# Patient Record
Sex: Male | Born: 1985 | Race: White | Hispanic: No | Marital: Married | Smoking: Never smoker
Health system: Southern US, Community
[De-identification: ages and names within clinical notes are randomized; demographics above are authoritative.]

## PROBLEM LIST (undated history)

## (undated) DIAGNOSIS — J45909 Unspecified asthma, uncomplicated: Secondary | ICD-10-CM

## (undated) DIAGNOSIS — I10 Essential (primary) hypertension: Secondary | ICD-10-CM

## (undated) HISTORY — PX: OTHER SURGICAL HISTORY: SHX169

---

## 2013-03-19 ENCOUNTER — Emergency Department (HOSPITAL_COMMUNITY)
Admission: EM | Admit: 2013-03-19 | Discharge: 2013-03-19 | Disposition: A | Discharge status: Home / Self Care | Payer: Self-pay | Attending: Emergency Medicine | Admitting: Emergency Medicine

## 2013-03-19 ENCOUNTER — Encounter (HOSPITAL_COMMUNITY): Payer: Self-pay | Admitting: *Deleted

## 2013-03-19 ENCOUNTER — Emergency Department (HOSPITAL_COMMUNITY): Payer: Self-pay

## 2013-03-19 DIAGNOSIS — J45901 Unspecified asthma with (acute) exacerbation: Secondary | ICD-10-CM | POA: Insufficient documentation

## 2013-03-19 HISTORY — DX: Unspecified asthma, uncomplicated: J45.909

## 2013-03-19 MED ORDER — PREDNISONE 20 MG PO TABS
60.0000 mg | ORAL_TABLET | Freq: Once | ORAL | Status: AC
  Administered 2013-03-19: 60 mg via ORAL
  Filled 2013-03-19: qty 3

## 2013-03-19 MED ORDER — ALBUTEROL SULFATE (2.5 MG/3ML) 0.083% IN NEBU: 2.5000 mg | INHALATION_SOLUTION | Freq: Four times a day (QID) | RESPIRATORY_TRACT | Status: DC | PRN

## 2013-03-19 MED ORDER — ALBUTEROL SULFATE (5 MG/ML) 0.5% IN NEBU
5.0000 mg | INHALATION_SOLUTION | Freq: Once | RESPIRATORY_TRACT | Status: AC
  Administered 2013-03-19: 5 mg via RESPIRATORY_TRACT
  Filled 2013-03-19: qty 1

## 2013-03-19 MED ORDER — IPRATROPIUM BROMIDE 0.02 % IN SOLN
0.5000 mg | Freq: Once | RESPIRATORY_TRACT | Status: AC
  Administered 2013-03-19: 0.5 mg via RESPIRATORY_TRACT
  Filled 2013-03-19: qty 2.5

## 2013-03-19 MED ORDER — PREDNISONE 20 MG PO TABS: 40.0000 mg | ORAL_TABLET | Freq: Every day | ORAL | Status: DC

## 2013-03-19 NOTE — ED Notes (Signed)
RT called for neb treatment

## 2013-03-19 NOTE — ED Notes (Signed)
Pt reports SOB which started today.  Pt has hx of asthma.  Denies any cp at this time.  Pt in NAD.

## 2013-03-19 NOTE — ED Provider Notes (Signed)
History  This chart was scribed for non-physician practitioner Sharilyn Sites, PA-C working with Gavin Pound. Oletta Lamas, MD, by Candelaria Stagers, ED Scribe. This patient was seen in room WTR9/WTR9 and the patient's care was started at 3:05 PM   CSN: [id number]  Arrival date & time 03/19/13  1445   None     Chief Complaint  Patient presents with  . Shortness of Breath     The history is provided by the patient. No language interpreter was used.   Juan Jenkins is a 27 y.o. male who presents to the Emergency Department complaining of SOB that became worse several days ago after he ran out of his albuterol nebulizer medication.  Pt has h/o asthma.  Pt reports he typical takes nebulizer and prednisone when these sx occur.  Nothing seems to make the sx better or worse.  No specific trigger to the attacks.  Mild, non-productive cough for the past few days since running out of albuterol.  Denies recent illness, sick contacts, fever, sweats, or chills.       Past Medical History  Diagnosis Date  . Asthma     History reviewed. No pertinent past surgical history.  No family history on file.  History  Substance Use Topics  . Smoking status: Never Smoker   . Smokeless tobacco: Not on file  . Alcohol Use: Yes     Comment: occa      Review of Systems  Respiratory: Positive for shortness of breath and wheezing.   All other systems reviewed and are negative.    Allergies  Review of patient's allergies indicates no known allergies.  Home Medications  No current outpatient prescriptions on file.  BP 148/73  Pulse 76  Temp(Src) 97.8 F (36.6 C) (Oral)  Resp 18  SpO2 96%  Physical Exam  Nursing note and vitals reviewed. Constitutional: He is oriented to person, place, and time. He appears well-developed and well-nourished.  HENT:  Head: Normocephalic and atraumatic.  Mouth/Throat: Oropharynx is clear and moist.  Eyes: Conjunctivae and EOM are normal.  Neck: Normal range of motion.  Neck supple.  Cardiovascular: Normal rate, regular rhythm and normal heart sounds.   Pulmonary/Chest: Effort normal. No respiratory distress. He has wheezes (diffuse bilateral wheezes).  Musculoskeletal: Normal range of motion.  Neurological: He is alert and oriented to person, place, and time.  Skin: Skin is warm and dry.  Psychiatric: He has a normal mood and affect.    ED Course  Procedures  DIAGNOSTIC STUDIES: Oxygen Saturation is 96% on room air, normal by my interpretation.    COORDINATION OF CARE:  3:08 PM Discussed course of care with pt which includes chest xray and breathing treatment.  Pt understands and agrees.   3:15 PM Ordered: Proventil 0.5% nebulizer solution 5 mg, Atrovent nebulizer solution 0.5 mg, and Deltasone tablet 60 mg.   4:03 PM Recheck: Pt reports his symptoms have improved.   Labs Reviewed - No data to display Dg Chest 2 View  03/19/2013  *RADIOLOGY REPORT*  Clinical Data: Shortness of breath  CHEST - 2 VIEW  Comparison: None.  Findings: Cardiomediastinal silhouette is unremarkable.  No acute infiltrate or pleural effusion.  No pulmonary edema.  Bony thorax is unremarkable.  IMPRESSION: No active disease.   Original Report Authenticated By: Natasha Mead, M.D.      1. Asthma exacerbation, mild       MDM   Patient with mild asthma exacerbation after running out of his nebulizer medication a few days  ago. Albuterol and Atrovent nebulizer treatment along with prednisone 60 mg were given in the ED with good resolution of wheezing. Patient states he feels much better. Rx albuterol as her solution and five-day course of prednisone 40 mg. Discussed plan with patient-he agreed. Return precautions advised.  I personally performed the services described in this documentation, which was scribed in my presence. The recorded information has been reviewed and is accurate.        Garlon Hatchet, PA-C 03/19/13 [phone]

## 2013-03-19 NOTE — ED Provider Notes (Signed)
Medical screening examination/treatment/procedure(s) were performed by non-physician practitioner and as supervising physician I was immediately available for consultation/collaboration.   Gavin Pound. Seri Kimmer, MD 03/19/13 1704

## 2013-05-30 ENCOUNTER — Encounter (HOSPITAL_COMMUNITY): Payer: Self-pay | Admitting: *Deleted

## 2013-05-30 ENCOUNTER — Emergency Department (HOSPITAL_COMMUNITY)
Admission: EM | Admit: 2013-05-30 | Discharge: 2013-05-30 | Disposition: A | Discharge status: Home / Self Care | Payer: Self-pay | Attending: Emergency Medicine | Admitting: Emergency Medicine

## 2013-05-30 DIAGNOSIS — R059 Cough, unspecified: Secondary | ICD-10-CM | POA: Insufficient documentation

## 2013-05-30 DIAGNOSIS — R05 Cough: Secondary | ICD-10-CM | POA: Insufficient documentation

## 2013-05-30 DIAGNOSIS — J45901 Unspecified asthma with (acute) exacerbation: Secondary | ICD-10-CM | POA: Insufficient documentation

## 2013-05-30 DIAGNOSIS — IMO0002 Reserved for concepts with insufficient information to code with codable children: Secondary | ICD-10-CM | POA: Insufficient documentation

## 2013-05-30 DIAGNOSIS — R0682 Tachypnea, not elsewhere classified: Secondary | ICD-10-CM | POA: Insufficient documentation

## 2013-05-30 DIAGNOSIS — Z79899 Other long term (current) drug therapy: Secondary | ICD-10-CM | POA: Insufficient documentation

## 2013-05-30 MED ORDER — PREDNISONE 20 MG PO TABS
60.0000 mg | ORAL_TABLET | Freq: Once | ORAL | Status: AC
  Administered 2013-05-30: 60 mg via ORAL
  Filled 2013-05-30: qty 3

## 2013-05-30 MED ORDER — IPRATROPIUM BROMIDE 0.02 % IN SOLN
0.5000 mg | Freq: Once | RESPIRATORY_TRACT | Status: AC
  Administered 2013-05-30: 0.5 mg via RESPIRATORY_TRACT
  Filled 2013-05-30: qty 2.5

## 2013-05-30 MED ORDER — ALBUTEROL SULFATE (5 MG/ML) 0.5% IN NEBU
5.0000 mg | INHALATION_SOLUTION | Freq: Once | RESPIRATORY_TRACT | Status: AC
  Administered 2013-05-30: 5 mg via RESPIRATORY_TRACT
  Filled 2013-05-30: qty 1

## 2013-05-30 MED ORDER — ALBUTEROL SULFATE HFA 108 (90 BASE) MCG/ACT IN AERS
1.0000 | INHALATION_SPRAY | RESPIRATORY_TRACT | Status: DC | PRN
  Administered 2013-05-30: 2 via RESPIRATORY_TRACT
  Filled 2013-05-30: qty 6.7

## 2013-05-30 MED ORDER — PREDNISONE 20 MG PO TABS: 40.0000 mg | ORAL_TABLET | Freq: Every day | ORAL | Status: DC

## 2013-05-30 NOTE — ED Provider Notes (Signed)
History    CSN: [id number] Arrival date & time 05/30/13  2005  First MD Initiated Contact with Patient 05/30/13 2018     Chief Complaint  Patient presents with  . Shortness of Breath   (Consider location/radiation/quality/duration/timing/severity/associated sxs/prior Treatment) Patient is a 27 y.o. male presenting with shortness of breath. The history is provided by the patient.  Shortness of Breath Severity:  Moderate Onset quality:  Gradual Duration:  2 days Timing:  Constant Progression:  Worsening Chronicity:  Recurrent Context: URI and weather changes   Relieved by:  Nothing Worsened by:  Exertion and activity Ineffective treatments:  Inhaler Associated symptoms: cough and wheezing   Associated symptoms: no abdominal pain, no fever and no sputum production   Risk factors comment:  Hx of asthma but advair inhaler not helping  Past Medical History  Diagnosis Date  . Asthma    History reviewed. No pertinent past surgical history. History reviewed. No pertinent family history. History  Substance Use Topics  . Smoking status: Never Smoker   . Smokeless tobacco: Not on file  . Alcohol Use: Yes     Comment: occa    Review of Systems  Constitutional: Negative for fever.  Respiratory: Positive for cough, shortness of breath and wheezing. Negative for sputum production.   Gastrointestinal: Negative for abdominal pain.  All other systems reviewed and are negative.    Allergies  Review of patient's allergies indicates no known allergies.  Home Medications   Current Outpatient Rx  Name  Route  Sig  Dispense  Refill  . albuterol (PROVENTIL) (2.5 MG/3ML) 0.083% nebulizer solution   Nebulization   Take 3 mLs (2.5 mg total) by nebulization every 6 (six) hours as needed for wheezing.   75 mL   1   . Fluticasone-Salmeterol (ADVAIR DISKUS) 500-50 MCG/DOSE AEPB   Inhalation   Inhale 1 puff into the lungs every 12 (twelve) hours.         . predniSONE (DELTASONE)  20 MG tablet   Oral   Take 2 tablets (40 mg total) by mouth daily.   10 tablet   0    BP 136/86  Pulse 82  Temp(Src) 98.4 F (36.9 C) (Oral)  Resp 18  SpO2 98% Physical Exam  Constitutional: He is oriented to person, place, and time. He appears well-developed and well-nourished. No distress.  HENT:  Head: Normocephalic and atraumatic.  Right Ear: External ear normal.  Left Ear: External ear normal.  Mouth/Throat: Oropharynx is clear and moist.  Eyes: Conjunctivae and EOM are normal. Pupils are equal, round, and reactive to light. Right eye exhibits no discharge.  Neck: Normal range of motion. Neck supple.  Cardiovascular: Normal rate, regular rhythm, normal heart sounds and intact distal pulses.   No murmur heard. Pulmonary/Chest: Tachypnea noted. No respiratory distress. He has decreased breath sounds in the right lower field and the left lower field. He has wheezes. He has no rhonchi. He has no rales.  Scant wheezes  Abdominal: Soft. There is no tenderness.  Musculoskeletal: Normal range of motion. He exhibits no edema and no tenderness.  Neurological: He is alert and oriented to person, place, and time.  Skin: Skin is warm and dry. No rash noted.  Psychiatric: He has a normal mood and affect.    ED Course  Procedures (including critical care time) Labs Reviewed - No data to display No results found. 1. Asthma attack     MDM   Pt with typical asthma exacerbation  symptoms.  No infectious sx, productive cough or other complaints.  Wheezing on exam.  will give steroids, albuterol/atrovent and recheck.  9:42 PM Pt feeling better.  Improved breath sounds.  Sent home with albuterol and prednisone.  Gwyneth Sprout, MD 05/30/13 [phone]

## 2013-05-30 NOTE — ED Notes (Signed)
Pt states for past few days he hasn't been able to breath good states he feels tight in his chest,  Pt uses Advair 500 daily , he has no rescue inhaler and he is also out of solution for nebulizer machine.  Pt is alert and oriented in NAD,  28 year old daughter at bedside

## 2013-06-08 ENCOUNTER — Encounter (HOSPITAL_COMMUNITY): Payer: Self-pay | Admitting: Adult Health

## 2013-06-08 ENCOUNTER — Emergency Department (HOSPITAL_COMMUNITY)
Admission: EM | Admit: 2013-06-08 | Discharge: 2013-06-08 | Disposition: A | Discharge status: Home / Self Care | Payer: Self-pay | Attending: Emergency Medicine | Admitting: Emergency Medicine

## 2013-06-08 DIAGNOSIS — J45909 Unspecified asthma, uncomplicated: Secondary | ICD-10-CM | POA: Insufficient documentation

## 2013-06-08 DIAGNOSIS — Z79899 Other long term (current) drug therapy: Secondary | ICD-10-CM | POA: Insufficient documentation

## 2013-06-08 DIAGNOSIS — G8929 Other chronic pain: Secondary | ICD-10-CM | POA: Insufficient documentation

## 2013-06-08 MED ORDER — HYDROCODONE-ACETAMINOPHEN 5-325 MG PO TABS: 1.0000 | ORAL_TABLET | ORAL | Status: DC | PRN

## 2013-06-08 MED ORDER — OXYCODONE-ACETAMINOPHEN 2.5-325 MG PO TABS: 1.0000 | ORAL_TABLET | ORAL | Status: DC | PRN

## 2013-06-08 MED ORDER — OXYCODONE-ACETAMINOPHEN 5-325 MG PO TABS: 1.0000 | ORAL_TABLET | ORAL | Status: DC | PRN
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
## 2013-06-09 NOTE — ED Provider Notes (Signed)
  Medical screening examination/treatment/procedure(s) were performed by non-physician practitioner and as supervising physician I was immediately available for consultation/collaboration.    Gerhard Munch, MD 06/09/13 [phone]

## 2017-10-13 ENCOUNTER — Encounter: Payer: Self-pay | Admitting: Emergency Medicine

## 2017-10-13 ENCOUNTER — Observation Stay
Admit: 2017-10-13 | Discharge: 2017-10-13 | Disposition: A | Discharge status: Home / Self Care | Payer: 59 | Attending: Internal Medicine | Admitting: Internal Medicine

## 2017-10-13 ENCOUNTER — Observation Stay: Payer: 59

## 2017-10-13 ENCOUNTER — Observation Stay
Admission: EM | Admit: 2017-10-13 | Discharge: 2017-10-13 | Disposition: A | Discharge status: Home / Self Care | Payer: 59 | Attending: Internal Medicine | Admitting: Internal Medicine

## 2017-10-13 ENCOUNTER — Other Ambulatory Visit: Payer: Self-pay

## 2017-10-13 ENCOUNTER — Emergency Department: Payer: 59

## 2017-10-13 DIAGNOSIS — G4733 Obstructive sleep apnea (adult) (pediatric): Secondary | ICD-10-CM | POA: Diagnosis not present

## 2017-10-13 DIAGNOSIS — Z823 Family history of stroke: Secondary | ICD-10-CM | POA: Insufficient documentation

## 2017-10-13 DIAGNOSIS — I10 Essential (primary) hypertension: Secondary | ICD-10-CM | POA: Insufficient documentation

## 2017-10-13 DIAGNOSIS — Z91018 Allergy to other foods: Secondary | ICD-10-CM | POA: Insufficient documentation

## 2017-10-13 DIAGNOSIS — R202 Paresthesia of skin: Secondary | ICD-10-CM | POA: Insufficient documentation

## 2017-10-13 DIAGNOSIS — Z7982 Long term (current) use of aspirin: Secondary | ICD-10-CM | POA: Insufficient documentation

## 2017-10-13 DIAGNOSIS — E669 Obesity, unspecified: Secondary | ICD-10-CM | POA: Diagnosis not present

## 2017-10-13 DIAGNOSIS — Z8673 Personal history of transient ischemic attack (TIA), and cerebral infarction without residual deficits: Secondary | ICD-10-CM | POA: Insufficient documentation

## 2017-10-13 DIAGNOSIS — Z91010 Allergy to peanuts: Secondary | ICD-10-CM | POA: Insufficient documentation

## 2017-10-13 DIAGNOSIS — J45909 Unspecified asthma, uncomplicated: Secondary | ICD-10-CM | POA: Diagnosis not present

## 2017-10-13 DIAGNOSIS — Z9989 Dependence on other enabling machines and devices: Secondary | ICD-10-CM | POA: Insufficient documentation

## 2017-10-13 DIAGNOSIS — Z6841 Body Mass Index (BMI) 40.0 and over, adult: Secondary | ICD-10-CM | POA: Diagnosis not present

## 2017-10-13 DIAGNOSIS — R200 Anesthesia of skin: Principal | ICD-10-CM | POA: Insufficient documentation

## 2017-10-13 DIAGNOSIS — Z79899 Other long term (current) drug therapy: Secondary | ICD-10-CM | POA: Diagnosis not present

## 2017-10-13 DIAGNOSIS — F419 Anxiety disorder, unspecified: Secondary | ICD-10-CM | POA: Diagnosis not present

## 2017-10-13 DIAGNOSIS — H539 Unspecified visual disturbance: Secondary | ICD-10-CM | POA: Diagnosis not present

## 2017-10-13 DIAGNOSIS — G459 Transient cerebral ischemic attack, unspecified: Secondary | ICD-10-CM | POA: Diagnosis present

## 2017-10-13 HISTORY — DX: Essential (primary) hypertension: I10

## 2017-10-13 LAB — DIFFERENTIAL
Basophils Absolute: 0.1 10*3/uL (ref 0–0.1)
Basophils Relative: 1 %
EOS ABS: 0.2 10*3/uL (ref 0–0.7)
EOS PCT: 2 %
Lymphocytes Relative: 27 %
Lymphs Abs: 2.6 10*3/uL (ref 1.0–3.6)
MONO ABS: 0.9 10*3/uL (ref 0.2–1.0)
MONOS PCT: 9 %
Neutro Abs: 5.9 10*3/uL (ref 1.4–6.5)
Neutrophils Relative %: 61 %

## 2017-10-13 LAB — HEMOGLOBIN A1C
HEMOGLOBIN A1C: 5.5 % (ref 4.8–5.6)
MEAN PLASMA GLUCOSE: 111.15 mg/dL

## 2017-10-13 LAB — CBC
HEMATOCRIT: 45.1 % (ref 40.0–52.0)
Hemoglobin: 15.3 g/dL (ref 13.0–18.0)
MCH: 29.2 pg (ref 26.0–34.0)
MCHC: 33.9 g/dL (ref 32.0–36.0)
MCV: 86.1 fL (ref 80.0–100.0)
Platelets: 251 10*3/uL (ref 150–440)
RBC: 5.23 MIL/uL (ref 4.40–5.90)
RDW: 13.4 % (ref 11.5–14.5)
WBC: 9.5 10*3/uL (ref 3.8–10.6)

## 2017-10-13 LAB — LIPID PANEL
CHOLESTEROL: 115 mg/dL (ref 0–200)
HDL: 27 mg/dL — AB (ref >40)
LDL Cholesterol: 63 mg/dL (ref 0–99)
Total CHOL/HDL Ratio: 4.3 RATIO
Triglycerides: 125 mg/dL (ref <150)
VLDL: 25 mg/dL (ref 0–40)

## 2017-10-13 LAB — PROTIME-INR
INR: 1.02
PROTHROMBIN TIME: 13.3 s (ref 11.4–15.2)

## 2017-10-13 LAB — TROPONIN I: Troponin I: <0.03 ng/mL (ref <0.03)

## 2017-10-13 LAB — COMPREHENSIVE METABOLIC PANEL
ALBUMIN: 4.3 g/dL (ref 3.5–5.0)
ALT: 65 U/L — AB (ref 17–63)
AST: 35 U/L (ref 15–41)
Alkaline Phosphatase: 60 U/L (ref 38–126)
Anion gap: 8 (ref 5–15)
BUN: 18 mg/dL (ref 6–20)
CO2: 25 mmol/L (ref 22–32)
CREATININE: 0.77 mg/dL (ref 0.61–1.24)
Calcium: 9.2 mg/dL (ref 8.9–10.3)
Chloride: 105 mmol/L (ref 101–111)
GFR calc Af Amer: >60 mL/min (ref >60)
GFR calc non Af Amer: >60 mL/min (ref >60)
GLUCOSE: 102 mg/dL — AB (ref 65–99)
POTASSIUM: 3.5 mmol/L (ref 3.5–5.1)
SODIUM: 138 mmol/L (ref 135–145)
TOTAL PROTEIN: 7.8 g/dL (ref 6.5–8.1)
Total Bilirubin: 0.4 mg/dL (ref 0.3–1.2)

## 2017-10-13 LAB — APTT: aPTT: 31 seconds (ref 24–36)

## 2017-10-13 LAB — GLUCOSE, CAPILLARY: Glucose-Capillary: 98 mg/dL (ref 65–99)

## 2017-10-13 LAB — VITAMIN B12: Vitamin B-12: 420 pg/mL (ref 180–914)

## 2017-10-13 MED ORDER — SODIUM CHLORIDE 0.9 % IV SOLN
INTRAVENOUS | Status: DC
  Administered 2017-10-13: 07:00:00 via INTRAVENOUS

## 2017-10-13 MED ORDER — STROKE: EARLY STAGES OF RECOVERY BOOK
Freq: Once | Status: AC
  Administered 2017-10-13: 06:00:00

## 2017-10-13 MED ORDER — MOMETASONE FURO-FORMOTEROL FUM 200-5 MCG/ACT IN AERO
2.0000 | INHALATION_SPRAY | Freq: Two times a day (BID) | RESPIRATORY_TRACT | Status: DC
  Administered 2017-10-13: 08:00:00 2 via RESPIRATORY_TRACT
  Filled 2017-10-13: qty 8.8

## 2017-10-13 MED ORDER — ACETAMINOPHEN 650 MG RE SUPP: 650.0000 mg | RECTAL | Status: DC | PRN

## 2017-10-13 MED ORDER — ASPIRIN 300 MG RE SUPP: 300.0000 mg | Freq: Every day | RECTAL | Status: DC

## 2017-10-13 MED ORDER — ACETAMINOPHEN 325 MG PO TABS: 650.0000 mg | ORAL_TABLET | ORAL | Status: DC | PRN

## 2017-10-13 MED ORDER — ACETAMINOPHEN 160 MG/5ML PO SOLN
650.0000 mg | ORAL | Status: DC | PRN
  Filled 2017-10-13: qty 20.3

## 2017-10-13 MED ORDER — ATORVASTATIN CALCIUM 20 MG PO TABS
40.0000 mg | ORAL_TABLET | Freq: Every day | ORAL | Status: DC
  Administered 2017-10-13: 08:00:00 40 mg via ORAL
  Filled 2017-10-13: qty 2

## 2017-10-13 MED ORDER — ALBUTEROL SULFATE (2.5 MG/3ML) 0.083% IN NEBU
2.5000 mg | INHALATION_SOLUTION | Freq: Four times a day (QID) | RESPIRATORY_TRACT | Status: DC | PRN
  Administered 2017-10-13: 2.5 mg via RESPIRATORY_TRACT
  Filled 2017-10-13: qty 3

## 2017-10-13 MED ORDER — ASPIRIN 325 MG PO TABS
325.0000 mg | ORAL_TABLET | Freq: Every day | ORAL | Status: DC
  Administered 2017-10-13: 08:00:00 325 mg via ORAL
  Filled 2017-10-13: qty 1

## 2017-10-13 MED ORDER — MONTELUKAST SODIUM 10 MG PO TABS: 10.0000 mg | ORAL_TABLET | Freq: Every day | ORAL | Status: DC

## 2017-10-13 MED ORDER — ESCITALOPRAM OXALATE 10 MG PO TABS
10.0000 mg | ORAL_TABLET | Freq: Every day | ORAL | Status: DC
  Administered 2017-10-13: 08:00:00 10 mg via ORAL
  Filled 2017-10-13: qty 1

## 2017-10-13 NOTE — Progress Notes (Signed)
Pts home cpap set up and turned on. Appears to be functioning properly

## 2017-10-13 NOTE — Consult Note (Signed)
Reason for Consult: L hand numbness  Referring Physician: Dr. Allena KatzPatel  CC: L hand numbness   HPI: Juan Jenkins is an 31 y.o. male  known history of bronchial asthma, hypertension presented to the emergency room with numbness of the left side of the face and left hand. Symptoms started around midnight. Symptoms have improved and pt just has subjective numbness L forearm.      Past Medical History:  Diagnosis Date  . Asthma   . Hypertension     Past Surgical History:  Procedure Laterality Date  . none      Family History  Problem Relation Age of Onset  . Hypertension Mother   . Diabetes Mellitus II Mother   . Hypertension Father   . Diabetes Mellitus II Father   . CVA Maternal Grandmother     Social History:  reports that  has never smoked. he has never used smokeless tobacco. He reports that he drinks alcohol. He reports that he does not use drugs.  Allergies  Allergen Reactions  . Gluten Meal   . Orange Fruit [Citrus]   . Peanut-Containing Drug Products   . Tomato     Medications: I have reviewed the patient's current medications.  ROS: History obtained from the patient  General ROS: negative for - chills, fatigue, fever, night sweats, weight gain or weight loss Psychological ROS: negative for - behavioral disorder, hallucinations, memory difficulties, mood swings or suicidal ideation Ophthalmic ROS: negative for - blurry vision, double vision, eye pain or loss of vision ENT ROS: negative for - epistaxis, nasal discharge, oral lesions, sore throat, tinnitus or vertigo Allergy and Immunology ROS: negative for - hives or itchy/watery eyes Hematological and Lymphatic ROS: negative for - bleeding problems, bruising or swollen lymph nodes Endocrine ROS: negative for - galactorrhea, hair pattern changes, polydipsia/polyuria or temperature intolerance Respiratory ROS: negative for - cough, hemoptysis, shortness of breath or wheezing Cardiovascular ROS: negative for - chest  pain, dyspnea on exertion, edema or irregular heartbeat Gastrointestinal ROS: negative for - abdominal pain, diarrhea, hematemesis, nausea/vomiting or stool incontinence Genito-Urinary ROS: negative for - dysuria, hematuria, incontinence or urinary frequency/urgency Musculoskeletal ROS: negative for - joint swelling or muscular weakness Neurological ROS: as noted in HPI Dermatological ROS: negative for rash and skin lesion changes  Physical Examination: Blood pressure 122/66, pulse 60, temperature 98 F (36.7 C), temperature source Oral, resp. rate 20, height 6' (1.829 m), weight (!) 136.5 kg (301 lb), SpO2 96 %.  HEENT-  Normocephalic, no lesions, without obvious abnormality.  Normal external eye and conjunctiva.  Normal TM's bilaterally.  Normal auditory canals and external ears. Normal external nose, mucus membranes and septum.  Normal pharynx. Cardiovascular- regular rate and rhythm, S1, S2 normal, no murmur, click, rub or gallop, pulses palpable throughout   Lungs- Heart exam - S1, S2 normal, no murmur, no gallop, rate regular Abdomen- soft, non-tender; bowel sounds normal; no masses,  no organomegaly Extremities- less then 2 second capillary refill Lymph-no adenopathy palpable Musculoskeletal-no joint tenderness, deformity or swelling Skin-warm and dry, no hyperpigmentation, vitiligo, or suspicious lesions  Neurological Examination   Mental Status: Alert, oriented, thought content appropriate.  Speech fluent without evidence of aphasia.  Able to follow 3 step commands without difficulty. Cranial Nerves: II: Discs flat bilaterally; Visual fields grossly normal, pupils equal, round, reactive to light and accommodation III,IV, VI: ptosis not present, extra-ocular motions intact bilaterally V,VII: smile symmetric, facial light touch sensation normal bilaterally VIII: hearing normal bilaterally IX,X: gag reflex present XI:  bilateral shoulder shrug XII: midline tongue  extension Motor: Right : Upper extremity   5/5    Left:     Upper extremity   5/5  Lower extremity   5/5     Lower extremity   5/5 Tone and bulk:normal tone throughout; no atrophy noted Sensory: Pinprick and light touch intact throughout, bilaterally Deep Tendon Reflexes: 2+ and symmetric throughout Plantars: Right: downgoing   Left: downgoing Cerebellar: normal finger-to-nose, normal rapid alternating movements and normal heel-to-shin test Gait: not tested.       Laboratory Studies:   Basic Metabolic Panel: Recent Labs  Lab 10/13/17 0217  NA 138  K 3.5  CL 105  CO2 25  GLUCOSE 102*  BUN 18  CREATININE 0.77  CALCIUM 9.2    Liver Function Tests: Recent Labs  Lab 10/13/17 0217  AST 35  ALT 65*  ALKPHOS 60  BILITOT 0.4  PROT 7.8  ALBUMIN 4.3   No results for input(s): LIPASE, AMYLASE in the last 168 hours. No results for input(s): AMMONIA in the last 168 hours.  CBC: Recent Labs  Lab 10/13/17 0217  WBC 9.5  NEUTROABS 5.9  HGB 15.3  HCT 45.1  MCV 86.1  PLT 251    Cardiac Enzymes: Recent Labs  Lab 10/13/17 0217  TROPONINI <0.03    BNP: Invalid input(s): POCBNP  CBG: Recent Labs  Lab 10/13/17 0227  GLUCAP 98    Microbiology: No results found for this or any previous visit.  Coagulation Studies: Recent Labs    10/13/17 0217  LABPROT 13.3  INR 1.02    Urinalysis: No results for input(s): COLORURINE, LABSPEC, PHURINE, GLUCOSEU, HGBUR, BILIRUBINUR, KETONESUR, PROTEINUR, UROBILINOGEN, NITRITE, LEUKOCYTESUR in the last 168 hours.  Invalid input(s): APPERANCEUR  Lipid Panel:     Component Value Date/Time   CHOL 115 10/13/2017 0558   TRIG 125 10/13/2017 0558   HDL 27 (L) 10/13/2017 0558   CHOLHDL 4.3 10/13/2017 0558   VLDL 25 10/13/2017 0558   LDLCALC 63 10/13/2017 0558    HgbA1C: No results found for: HGBA1C  Urine Drug Screen:  No results found for: LABOPIA, COCAINSCRNUR, LABBENZ, AMPHETMU, THCU, LABBARB  Alcohol Level: No  results for input(s): ETH in the last 168 hours.  Other results: EKG: normal EKG, normal sinus rhythm, unchanged from previous tracings.  Imaging: [place] Carotid Bilateral (at [facility] Only)  Result Date: 10/13/2017 CLINICAL DATA:  TIA and visual disturbance. History of hypertension. EXAM: BILATERAL CAROTID DUPLEX ULTRASOUND TECHNIQUE: Wallace CullensGray scale imaging, color Doppler and duplex ultrasound were performed of bilateral carotid and vertebral arteries in the neck. COMPARISON:  None. FINDINGS: Criteria: Quantification of carotid stenosis is based on velocity parameters that correlate the residual internal carotid diameter with NASCET-based stenosis levels, using the diameter of the distal internal carotid lumen as the denominator for stenosis measurement. The following velocity measurements were obtained: RIGHT ICA:  91/19 cm/sec CCA:  143/17 cm/sec SYSTOLIC ICA/CCA RATIO:  0.6 DIASTOLIC ICA/CCA RATIO:  1.1 ECA:  155 cm/sec LEFT ICA:  83/20 cm/sec CCA:  139/16 cm/sec SYSTOLIC ICA/CCA RATIO:  0.6 DIASTOLIC ICA/CCA RATIO:  1.3 ECA:  123 cm/sec RIGHT CAROTID ARTERY: There is no grayscale evidence significant intimal thickening or atherosclerotic plaque affecting interrogated portions of the right carotid system. There are no elevated peak systolic velocities within the interrogated course the right internal carotid artery to suggest a hemodynamically significant stenosis. RIGHT VERTEBRAL ARTERY:  Antegrade flow LEFT CAROTID ARTERY: There is no grayscale evidence significant intimal thickening or  atherosclerotic plaque affecting interrogated portions of the left carotid system. There are no elevated peak systolic velocities within the interrogated course the left internal carotid artery to suggest a hemodynamically significant stenosis. LEFT VERTEBRAL ARTERY:  Antegrade flow IMPRESSION: Normal carotid Doppler ultrasound. Electronically Signed   By: Simonne Come M.D.   On: 10/13/2017 10:43   Ct Head Code Stroke Wo  Contrast  Result Date: 10/13/2017 CLINICAL DATA:  Code stroke.  LEFT-sided numbness.  Recent TIA. EXAM: CT HEAD WITHOUT CONTRAST TECHNIQUE: Contiguous axial images were obtained from the base of the skull through the vertex without intravenous contrast. COMPARISON:  None. FINDINGS: BRAIN: No intraparenchymal hemorrhage, mass effect nor midline shift. The ventricles and sulci are normal. No acute large vascular territory infarcts. No abnormal extra-axial fluid collections. Basal cisterns are patent. VASCULAR: Unremarkable. SKULL/SOFT TISSUES: No skull fracture. Old suspected RIGHT nasal bone fracture. No significant soft tissue swelling. ORBITS/SINUSES: The included ocular globes and orbital contents are normal.The mastoid aircells and included paranasal sinuses are well-aerated. OTHER: None. ASPECTS [facility] Stroke Program Early CT Score) - Ganglionic level infarction (caudate, lentiform nuclei, internal capsule, insula, M1-M3 cortex): 7 - Supraganglionic infarction (M4-M6 cortex): 3 Total score (0-10 with 10 being normal): 10 IMPRESSION: 1. Normal noncontrast CT HEAD. 2. ASPECTS is 10. 3. Critical Value/emergent results were called by telephone at the time of interpretation on 10/13/2017 at 2:23 am to Dr. York Cerise, who verbally acknowledged these results. Electronically Signed   By: Awilda Metro M.D.   On: 10/13/2017 02:23     Assessment/Plan:  31 y.o. male  known history of bronchial asthma, hypertension presented to the emergency room with numbness of the left side of the face and left hand. Symptoms started around midnight. Symptoms have improved and pt just has subjective numbness L forearm.    - Awaiting MRI to be done - Pt is anxious and possibly related to anxiety - If ischemia on MRI then would order hypercoagulable profile panel.  - ASA daily - pt/ot - possible d/c today 10/13/2017, 11:18 AM

## 2017-10-13 NOTE — ED Notes (Signed)
Admitting MD at bedside.

## 2017-10-13 NOTE — Consult Note (Signed)
[facility] TeleNeurology Consult Services  Impression:  Acute Ischemic Stroke - right hemispheric (?right thalamic) unknown etiology but given age, would be concerned for hypercoagulable state versus cardioembolic source.  However, given stereotypical episodes, simple partial seizure, complicated migraine, and stress reastion are also on the differential.   Not a tpa candidate due to: lows NIHSS of 1 for mild sensory symptoms (nondisabling symptoms)  Presentation is not suggestive of Large Vessel Occlusive Disease. Thrombectomy would not be recommended.  Differential Diagnosis:   1. Cardioembolic stroke  2. Small vessel disease/lacune  3. Thromboembolic, artery-to-artery mechanism  4. Hypercoagulable state-related infarct  5. Transient ischemic attack  6. Thrombotic mechanism, large artery disease   Comments: [facility] contacted: [phone] [facility] at bedside: 231 NIHSS assessment time: 240  Recommendations:  1. Admit to stroke/telemetry 2. Head of bed flat 3. IV Fluids, NS 4. Aspirin if no contraindications 5. DVT prophylaxis 6. Dysphagia screen 7. Neuro checks 8. In house neurology consult.  9. Will likely need MRI and possibly TEE and EEG but will leave this up to the discretion of the in house neurologist and primary team 10. Further stroke workup per in house neurology/internal medicine  Discussed with ED attending.  Please call [facility] if you have any questions: [phone]  -----------------------------------------------------------------------------------------  CC Left sided numbness  History of Present Illness   Patient is a 31 year old man with recent TIA one week ago who presents with left foot and left arm numbness/weakness.  Last seen normal was 10:30 pm before he went to bed.  He woke up at 12:30 AM with the symptoms.  It began in his left foot and then this resolved.  After this resolved, he began feeling numbness in his left arm as  well, which eventually began feeling weak.  This is a similar presentation as his last TIA, per the patient.  [facility]  Diagnostic: CT head reviewed: no acute intracranial abnormality.  Exam: Patient is in no apparent distress. Patient appears as stated age. No obvious acute respiratory or cardiac distress. Patient is well groomed and well-nourished.  1a- LOC: Keenly responsive - 0  1b- LOC questions: Answers both questions correctly - 0  1c- LOC commands- Performs both tasks correctly- 0  2- Gaze: Normal; no gaze paresis or gaze deviation - 0  3- Visual Fields: 0  4- Facial movements: 0  5- Upper limb motor - 0  6- Lower limb motor - 0  7- Limb Coordination: absent ataxia - 0  8- Sensory: 1 (mild decreased sensation L arm and left face) 9- Language - 0  10- Speech - 0  11- Neglect - none found - 0  NIHSS score: 1   Medical Decision Making:  - Extensive number of diagnosis or management options are considered above. - Extensive amount of complex data reviewed. - High risk of complication and/or morbidity or mortality are associated with differential diagnostic considerations above.  - There may be Uncertain outcome and increased probability of prolonged functional impairment or high probability of severe prolonged functional impairment associated with some of these differential diagnosis.  Medical Data Reviewed:  1.Data reviewed include clinical labs, radiology,Medical Tests; 2.Tests results discussed w/performing or interpreting physician; 3.Obtaining/reviewing old medical records;  4.Obtaining case history from another source;  5.Independent review of image, tracing or specimen.    Patient was informed the Neurology Consult would happen via telehealth (remote video) and consented to receiving care in this manner.

## 2017-10-13 NOTE — H&P (Signed)
[facility] - [place] at [facility]   PATIENT NAME: Juan Jenkins    MR#:  [id number]  DATE OF BIRTH:  June 06, 1986  DATE OF ADMISSION:  10/13/2017  PRIMARY CARE PHYSICIAN: Patient, No Pcp Per   REQUESTING/REFERRING PHYSICIAN:   CHIEF COMPLAINT:   Chief Complaint  Patient presents with  . Numbness    HISTORY OF PRESENT ILLNESS: [name]  is a 31 y.o. male with a known history of bronchial asthma, hypertension presented to the emergency room with numbness of the left side of the face and left hand. Symptoms started around midnight. Patient is visiting [place] from [place]. Patient also says he has some weakness in the left arm. No complaints of any slurred speech, difficulty swallowing food. Patient says he has had a transient ischemic attack couple of days ago. No complaints of chest pain, shortness of breath. He was evaluated with CT head in the emergency room. Tele neurology consultation was done who recommended aspirin and further workup with MRI brain, echocardiogram and carotid ultrasound.  PAST MEDICAL HISTORY:   Past Medical History:  Diagnosis Date  . Asthma   . Hypertension     PAST SURGICAL HISTORY:  Past Surgical History:  Procedure Laterality Date  . none      SOCIAL HISTORY:  Social History   Tobacco Use  . Smoking status: Never Smoker  . Smokeless tobacco: Never Used  Substance Use Topics  . Alcohol use: Yes    Comment: occa    FAMILY HISTORY:  Family History  Problem Relation Age of Onset  . Hypertension Mother   . Diabetes Mellitus II Mother   . Hypertension Father   . Diabetes Mellitus II Father   . CVA Maternal Grandmother     DRUG ALLERGIES: No Known Allergies  REVIEW OF SYSTEMS:   CONSTITUTIONAL: No fever, fatigue or weakness.  EYES: No blurred or double vision.  EARS, NOSE, AND THROAT: No tinnitus or ear pain.  RESPIRATORY: No cough, shortness of breath, wheezing or hemoptysis.  CARDIOVASCULAR: No chest  pain, orthopnea, edema.  GASTROINTESTINAL: No nausea, vomiting, diarrhea or abdominal pain.  GENITOURINARY: No dysuria, hematuria.  ENDOCRINE: No polyuria, nocturia,  HEMATOLOGY: No anemia, easy bruising or bleeding SKIN: No rash or lesion. MUSCULOSKELETAL: No joint pain or arthritis.   NEUROLOGIC: No tingling,  numbness left side of face, weakness left arm.  PSYCHIATRY: No anxiety or depression.   MEDICATIONS AT HOME:  Prior to Admission medications   Medication Sig Start Date End Date Taking? Authorizing Provider  albuterol (PROVENTIL HFA;VENTOLIN HFA) 108 (90 BASE) MCG/ACT inhaler Inhale 2 puffs into the lungs every 6 (six) hours as needed for wheezing.   Yes [provider]  aspirin 81 MG chewable tablet Chew 81 mg daily by mouth.   Yes [provider]  atorvastatin (LIPITOR) 40 MG tablet Take 40 mg daily by mouth.   Yes [provider]  escitalopram (LEXAPRO) 10 MG tablet Take 10 mg daily by mouth.   Yes [provider]  Fluticasone-Salmeterol (ADVAIR DISKUS) 500-50 MCG/DOSE AEPB Inhale 1 puff into the lungs every 12 (twelve) hours.   Yes [provider]  losartan-hydrochlorothiazide (HYZAAR) 100-25 MG tablet Take 1 tablet daily by mouth.   Yes [provider]  montelukast (SINGULAIR) 10 MG tablet Take 10 mg at bedtime by mouth.   Yes [provider]  potassium chloride (K-DUR) 10 MEQ tablet Take 10 mEq daily by mouth.   Yes [provider]  PHYSICAL EXAMINATION:   VITAL SIGNS: Blood pressure 126/81, pulse (!) 49, temperature 97.9 F (36.6 C), resp. rate 14, height 6' (1.829 m), weight (!) 136.5 kg (301 lb), SpO2 97 %.  GENERAL:  31 y.o.-year-old patient lying in the bed with no acute distress.  EYES: Pupils equal, round, reactive to light and accommodation. No scleral icterus. Extraocular muscles intact.  HEENT: Head atraumatic, normocephalic. Oropharynx and nasopharynx clear.  NECK:  Supple, no jugular  venous distention. No thyroid enlargement, no tenderness.  LUNGS: Normal breath sounds bilaterally, no wheezing, rales,rhonchi or crepitation. No use of accessory muscles of respiration.  CARDIOVASCULAR: S1, S2 normal. No murmurs, rubs, or gallops.  ABDOMEN: Soft, nontender, nondistended. Bowel sounds present. No organomegaly or mass.  EXTREMITIES: No pedal edema, cyanosis, or clubbing.  NEUROLOGIC: Cranial nerves II through XII are intact. Muscle strength 5/5 in all extremities. Sensation intact. Gait not checked.  PSYCHIATRIC: The patient is alert and oriented x 3.  SKIN: No obvious rash, lesion, or ulcer.   LABORATORY PANEL:   CBC Recent Labs  Lab 10/13/17 0217  WBC 9.5  HGB 15.3  HCT 45.1  PLT 251  MCV 86.1  MCH 29.2  MCHC 33.9  RDW 13.4  LYMPHSABS 2.6  MONOABS 0.9  EOSABS 0.2  BASOSABS 0.1   ------------------------------------------------------------------------------------------------------------------  Chemistries  Recent Labs  Lab 10/13/17 0217  NA 138  K 3.5  CL 105  CO2 25  GLUCOSE 102*  BUN 18  CREATININE 0.77  CALCIUM 9.2  AST 35  ALT 65*  ALKPHOS 60  BILITOT 0.4   ------------------------------------------------------------------------------------------------------------------ estimated creatinine clearance is 189.8 mL/min (by C-G formula based on SCr of 0.77 mg/dL). ------------------------------------------------------------------------------------------------------------------ No results for input(s): TSH, T4TOTAL, T3FREE, THYROIDAB in the last 72 hours.  Invalid input(s): FREET3   Coagulation profile Recent Labs  Lab 10/13/17 0217  INR 1.02   ------------------------------------------------------------------------------------------------------------------- No results for input(s): DDIMER in the last 72 hours. -------------------------------------------------------------------------------------------------------------------  Cardiac  Enzymes Recent Labs  Lab 10/13/17 0217  TROPONINI <0.03   ------------------------------------------------------------------------------------------------------------------ Invalid input(s): POCBNP  ---------------------------------------------------------------------------------------------------------------  Urinalysis No results found for: COLORURINE, APPEARANCEUR, LABSPEC, PHURINE, GLUCOSEU, HGBUR, BILIRUBINUR, KETONESUR, PROTEINUR, UROBILINOGEN, NITRITE, LEUKOCYTESUR   RADIOLOGY: Ct Head Code Stroke Wo Contrast  Result Date: 10/13/2017 CLINICAL DATA:  Code stroke.  LEFT-sided numbness.  Recent TIA. EXAM: CT HEAD WITHOUT CONTRAST TECHNIQUE: Contiguous axial images were obtained from the base of the skull through the vertex without intravenous contrast. COMPARISON:  None. FINDINGS: BRAIN: No intraparenchymal hemorrhage, mass effect nor midline shift. The ventricles and sulci are normal. No acute large vascular territory infarcts. No abnormal extra-axial fluid collections. Basal cisterns are patent. VASCULAR: Unremarkable. SKULL/SOFT TISSUES: No skull fracture. Old suspected RIGHT nasal bone fracture. No significant soft tissue swelling. ORBITS/SINUSES: The included ocular globes and orbital contents are normal.The mastoid aircells and included paranasal sinuses are well-aerated. OTHER: None. ASPECTS [facility]([place] Stroke Program Early CT Score) - Ganglionic level infarction (caudate, lentiform nuclei, internal capsule, insula, M1-M3 cortex): 7 - Supraganglionic infarction (M4-M6 cortex): 3 Total score (0-10 with 10 being normal): 10 IMPRESSION: 1. Normal noncontrast CT HEAD. 2. ASPECTS is 10. 3. Critical Value/emergent results were called by telephone at the time of interpretation on 10/13/2017 at 2:23 am to Dr. York CeriseFORBACH, who verbally acknowledged these results. Electronically Signed   By: Awilda Metroourtnay  Bloomer M.D.   On: 10/13/2017 02:23    EKG: Orders placed or performed during the hospital  encounter of 10/13/17  . ED EKG  . ED EKG  . EKG  12-Lead  . EKG 12-Lead    IMPRESSION AND PLAN: 31 year old male patient with history of hypertension presented to the emergency room with numbness of left side of the face, weakness of left arm.  Admitting diagnosis 1. Transient ischemic attack 2. Hypertension 3. Bronchial asthma Treatment plan Admit patient to medical floor observation bed Aspirin 325 MG orally daily Check MRI brain Carotid ultrasound and echocardiogram Neurology consultation Neuro check for 24 hours   All the records are reviewed and case discussed with ED provider. Management plans discussed with the patient, family and they are in agreement.  CODE STATUS:FULL CODE Code Status History    This patient does not have a recorded code status. Please follow your organizational policy for patients in this situation.       TOTAL TIME TAKING CARE OF THIS PATIENT: 51 minutes.    Ihor AustinPavan Pyreddy M.D on 10/13/2017 at 5:04 AM  Between 7am to 6pm - Pager - [phone]  After 6pm go to [email or web address] - password [email or web address]  [facility] [place] [facility]  Office  [phone]  CC: Primary care physician; Patient, No Pcp Per

## 2017-10-13 NOTE — ED Notes (Signed)
Pt now speaking with Dr Sherrie SportBoudreau on computer in room

## 2017-10-13 NOTE — ED Notes (Addendum)
NIHSS scale exam performed by Dr Sherrie SportBoudreau assisted by this RN; pt voices change in sensation to left side of his face and left arm only; no other differences reported

## 2017-10-13 NOTE — ED Notes (Addendum)
Ronelle NighSusan Benton, RN on screen in room; pt just getting to treatment room from CT; reports blurry vision earlier on Saturday around 5-6pm; pt had gone to bed around 1030pm and woke around 1230am with numbness to left arm; pt's speech clear and answering questions without difficulty; visitor present says [facility] [place] this past week and diagnosed with TIA's;

## 2017-10-13 NOTE — ED Notes (Signed)
Jacki ConesLaurie RN, informed of bed assigned

## 2017-10-13 NOTE — ED Triage Notes (Signed)
Pt ambulatory to triage in NAD, report left forearm and hand numbness and some numbness to left foot.  Speech clear, smile even, equal hand grasp.  Pt reports recently in hospital (d/c Wednesday) for possible TIA.

## 2017-10-13 NOTE — Discharge Summary (Signed)
[facility] - [place] [facility]   PATIENT NAME: Juan Jenkins    MR#:  [id number]  DATE OF BIRTH:  1986-01-07  DATE OF ADMISSION:  10/13/2017 ADMITTING PHYSICIAN: Ihor Austin, MD  DATE OF DISCHARGE: 10/13/2017 PRIMARY CARE PHYSICIAN: Patient, No Pcp Per    ADMISSION DIAGNOSIS:  Numbness [R20.0] Left sided numbness [R20.0]  DISCHARGE DIAGNOSIS:  Left sided tingling and numbness with tingling in the forehead--suspected anxiety related HTN obesity OSA  SECONDARY DIAGNOSIS:   Past Medical History:  Diagnosis Date  . Asthma   . Hypertension     HOSPITAL COURSE:  Juan Jenkins is an 32 y.o. male known history of bronchial asthma, hypertension presented to the emergency room with numbness of the left side of the face and left hand.Symptoms started around midnight  1. Left side numbness with tingling and forehead tingling -ruled out CVA -pt lately has been having issues with anxiety -started on escitalopram by PCP -cont asa Appreciate neurology input--agrees with plan  2.HTN -cont home meds  3. OSA On cpap  Overall stable. Stroke w/u negative  D/c home  CONSULTS OBTAINED:  Treatment Team:  Pauletta Browns, MD  DRUG ALLERGIES:   Allergies  Allergen Reactions  . Gluten Meal   . Orange Fruit [Citrus]   . Peanut-Containing Drug Products   . Tomato     DISCHARGE MEDICATIONS:   Current Discharge Medication List    CONTINUE these medications which have NOT CHANGED   Details  albuterol (PROVENTIL HFA;VENTOLIN HFA) 108 (90 BASE) MCG/ACT inhaler Inhale 2 puffs into the lungs every 6 (six) hours as needed for wheezing.    aspirin 81 MG chewable tablet Chew 81 mg daily by mouth.    atorvastatin (LIPITOR) 40 MG tablet Take 40 mg daily by mouth.    escitalopram (LEXAPRO) 10 MG tablet Take 10 mg daily by mouth.    Fluticasone-Salmeterol (ADVAIR DISKUS) 500-50 MCG/DOSE AEPB Inhale 1 puff into the lungs every 12 (twelve) hours.     losartan-hydrochlorothiazide (HYZAAR) 100-25 MG tablet Take 1 tablet daily by mouth.    montelukast (SINGULAIR) 10 MG tablet Take 10 mg at bedtime by mouth.    potassium chloride (K-DUR) 10 MEQ tablet Take 10 mEq daily by mouth.        If you experience worsening of your admission symptoms, develop shortness of breath, life threatening emergency, suicidal or homicidal thoughts you must seek medical attention immediately by calling 911 or calling your MD immediately  if symptoms less severe.  You Must read complete instructions/literature along with all the possible adverse reactions/side effects for all the Medicines you take and that have been prescribed to you. Take any new Medicines after you have completely understood and accept all the possible adverse reactions/side effects.   Please note  You were cared for by a hospitalist during your hospital stay. If you have any questions about your discharge medications or the care you received while you were in the hospital after you are discharged, you can call the unit and asked to speak with the hospitalist on call if the hospitalist that took care of you is not available. Once you are discharged, your primary care physician will handle any further medical issues. Please note that NO REFILLS for any discharge medications will be authorized once you are discharged, as it is imperative that you return to your primary care physician (or establish a relationship with a primary care physician if you do not have one) for your aftercare needs  so that they can reassess your need for medications and monitor your lab values. Today   SUBJECTIVE   Doing well. Minimal tingling on fingers VITAL SIGNS:  Blood pressure 133/75, pulse (!) 56, temperature 98.3 F (36.8 C), temperature source Oral, resp. rate 18, height 6' (1.829 m), weight (!) 136.5 kg (301 lb), SpO2 96 %.  I/O:    Intake/Output Summary (Last 24 hours) at 10/13/2017 1636 Last data filed  at 10/13/2017 1400 Gross per 24 hour  Intake 480 ml  Output -  Net 480 ml    PHYSICAL EXAMINATION:  GENERAL:  31 y.o.-year-old patient lying in the bed with no acute distress.  EYES: Pupils equal, round, reactive to light and accommodation. No scleral icterus. Extraocular muscles intact.  HEENT: Head atraumatic, normocephalic. Oropharynx and nasopharynx clear.  NECK:  Supple, no jugular venous distention. No thyroid enlargement, no tenderness.  LUNGS: Normal breath sounds bilaterally, no wheezing, rales,rhonchi or crepitation. No use of accessory muscles of respiration.  CARDIOVASCULAR: S1, S2 normal. No murmurs, rubs, or gallops.  ABDOMEN: Soft, non-tender, non-distended. Bowel sounds present. No organomegaly or mass.  EXTREMITIES: No pedal edema, cyanosis, or clubbing.  NEUROLOGIC: Cranial nerves II through XII are intact. Muscle strength 5/5 in all extremities. Sensation intact. Gait not checked.  PSYCHIATRIC: The patient is alert and oriented x 3.  SKIN: No obvious rash, lesion, or ulcer.   DATA REVIEW:   CBC  Recent Labs  Lab 10/13/17 0217  WBC 9.5  HGB 15.3  HCT 45.1  PLT 251    Chemistries  Recent Labs  Lab 10/13/17 0217  NA 138  K 3.5  CL 105  CO2 25  GLUCOSE 102*  BUN 18  CREATININE 0.77  CALCIUM 9.2  AST 35  ALT 65*  ALKPHOS 60  BILITOT 0.4    Microbiology Results   No results found for this or any previous visit (from the past 240 hour(s)).  RADIOLOGY:  Mr Brain Wo Contrast  Result Date: 10/13/2017 CLINICAL DATA:  TIA EXAM: MRI HEAD WITHOUT CONTRAST MRA HEAD WITHOUT CONTRAST TECHNIQUE: Multiplanar, multiecho pulse sequences of the brain and surrounding structures were obtained without intravenous contrast. Angiographic images of the head were obtained using MRA technique without contrast. COMPARISON:  CT 10/13/2017 FINDINGS: MRI HEAD FINDINGS Brain: No acute infarction, hemorrhage, hydrocephalus, extra-axial collection or mass lesion. Vascular:  Normal arterial flow void Skull and upper cervical spine: Negative Sinuses/Orbits: Negative Other: None MRA HEAD FINDINGS Both vertebral arteries are normal. Basilar normal. PICA, left AICA, superior cerebellar, and posterior cerebral artery is patent. Internal carotid artery widely patent bilaterally. Anterior and middle cerebral arteries normal bilaterally. Negative for cerebral aneurysm IMPRESSION: Negative MRI head Negative MRA head Electronically Signed   By: Marlan Palauharles  Clark M.D.   On: 10/13/2017 12:56   [place] Carotid Bilateral (at [facility] Only)  Result Date: 10/13/2017 CLINICAL DATA:  TIA and visual disturbance. History of hypertension. EXAM: BILATERAL CAROTID DUPLEX ULTRASOUND TECHNIQUE: Wallace CullensGray scale imaging, color Doppler and duplex ultrasound were performed of bilateral carotid and vertebral arteries in the neck. COMPARISON:  None. FINDINGS: Criteria: Quantification of carotid stenosis is based on velocity parameters that correlate the residual internal carotid diameter with NASCET-based stenosis levels, using the diameter of the distal internal carotid lumen as the denominator for stenosis measurement. The following velocity measurements were obtained: RIGHT ICA:  91/19 cm/sec CCA:  143/17 cm/sec SYSTOLIC ICA/CCA RATIO:  0.6 DIASTOLIC ICA/CCA RATIO:  1.1 ECA:  155 cm/sec LEFT ICA:  83/20 cm/sec  CCA:  139/16 cm/sec SYSTOLIC ICA/CCA RATIO:  0.6 DIASTOLIC ICA/CCA RATIO:  1.3 ECA:  123 cm/sec RIGHT CAROTID ARTERY: There is no grayscale evidence significant intimal thickening or atherosclerotic plaque affecting interrogated portions of the right carotid system. There are no elevated peak systolic velocities within the interrogated course the right internal carotid artery to suggest a hemodynamically significant stenosis. RIGHT VERTEBRAL ARTERY:  Antegrade flow LEFT CAROTID ARTERY: There is no grayscale evidence significant intimal thickening or atherosclerotic plaque affecting interrogated portions of the  left carotid system. There are no elevated peak systolic velocities within the interrogated course the left internal carotid artery to suggest a hemodynamically significant stenosis. LEFT VERTEBRAL ARTERY:  Antegrade flow IMPRESSION: Normal carotid Doppler ultrasound. Electronically Signed   By: Simonne ComeJohn  Watts M.D.   On: 10/13/2017 10:43   Mr Maxine GlennMra Head/brain ZOWo Cm  Result Date: 10/13/2017 CLINICAL DATA:  TIA EXAM: MRI HEAD WITHOUT CONTRAST MRA HEAD WITHOUT CONTRAST TECHNIQUE: Multiplanar, multiecho pulse sequences of the brain and surrounding structures were obtained without intravenous contrast. Angiographic images of the head were obtained using MRA technique without contrast. COMPARISON:  CT 10/13/2017 FINDINGS: MRI HEAD FINDINGS Brain: No acute infarction, hemorrhage, hydrocephalus, extra-axial collection or mass lesion. Vascular: Normal arterial flow void Skull and upper cervical spine: Negative Sinuses/Orbits: Negative Other: None MRA HEAD FINDINGS Both vertebral arteries are normal. Basilar normal. PICA, left AICA, superior cerebellar, and posterior cerebral artery is patent. Internal carotid artery widely patent bilaterally. Anterior and middle cerebral arteries normal bilaterally. Negative for cerebral aneurysm IMPRESSION: Negative MRI head Negative MRA head Electronically Signed   By: Marlan Palauharles  Clark M.D.   On: 10/13/2017 12:56   Ct Head Code Stroke Wo Contrast  Result Date: 10/13/2017 CLINICAL DATA:  Code stroke.  LEFT-sided numbness.  Recent TIA. EXAM: CT HEAD WITHOUT CONTRAST TECHNIQUE: Contiguous axial images were obtained from the base of the skull through the vertex without intravenous contrast. COMPARISON:  None. FINDINGS: BRAIN: No intraparenchymal hemorrhage, mass effect nor midline shift. The ventricles and sulci are normal. No acute large vascular territory infarcts. No abnormal extra-axial fluid collections. Basal cisterns are patent. VASCULAR: Unremarkable. SKULL/SOFT TISSUES: No skull  fracture. Old suspected RIGHT nasal bone fracture. No significant soft tissue swelling. ORBITS/SINUSES: The included ocular globes and orbital contents are normal.The mastoid aircells and included paranasal sinuses are well-aerated. OTHER: None. ASPECTS [facility]([place] Stroke Program Early CT Score) - Ganglionic level infarction (caudate, lentiform nuclei, internal capsule, insula, M1-M3 cortex): 7 - Supraganglionic infarction (M4-M6 cortex): 3 Total score (0-10 with 10 being normal): 10 IMPRESSION: 1. Normal noncontrast CT HEAD. 2. ASPECTS is 10. 3. Critical Value/emergent results were called by telephone at the time of interpretation on 10/13/2017 at 2:23 am to Dr. York CeriseFORBACH, who verbally acknowledged these results. Electronically Signed   By: Awilda Metroourtnay  Bloomer M.D.   On: 10/13/2017 02:23     Management plans discussed with the patient, family and they are in agreement.  CODE STATUS:     Code Status Orders  (From admission, onward)        Start     Ordered   10/13/17 0513  Full code  Continuous     10/13/17 0512    Code Status History    Date Active Date Inactive Code Status Order ID Comments User Context   This patient has a current code status but no historical code status.      TOTAL TIME TAKING CARE OF THIS PATIENT: *40* minutes.    Cassundra Mckeever M.D on 10/13/2017 at  4:36 PM  Between 7am to 6pm - Pager - [phone] After 6pm go to [email or web address] - password [employer]  [facility]  Office  [phone]  CC: Primary care physician; Patient, No Pcp Per

## 2017-10-13 NOTE — ED Notes (Signed)
Dr. Webster at bedside.  

## 2017-10-13 NOTE — ED Notes (Signed)
Patient transported to room 126 

## 2017-10-13 NOTE — ED Notes (Signed)
Blood glucose 98

## 2017-10-13 NOTE — ED Notes (Signed)
Pt sitting up in bed watching tv, wife has gone home for his Cpap machine; call bell in reach;

## 2017-10-13 NOTE — ED Notes (Signed)
Pt says sensation to left side of his face now equal to the right side; but left arm still feels different to right arm; speech remains clear; waiting for admitting MD and admitting room assignment

## 2017-10-13 NOTE — Evaluation (Signed)
Physical Therapy Evaluation Patient Details Name: Juan BosworthJason Loa MRN: [id number] DOB: Feb 09, 1986 Today's Date: 10/13/2017   History of Present Illness  Pt is a 31 y.o. male presenting to hospital with L foot, L arm, and L face numbness/weakness.  Pt with recent TIA 1 week ago.  PMH includes TIA and htn.  Clinical Impression  Prior to hospital admission, pt was independent.  Pt lives with wife and 3 children in multi-story home with multiple stairs.  Currently pt is independent with bed mobility, transfers, ambulation, and navigating stairs.  Very mild decreased sensation noted L UE overall (pt reporting L UE feels "heavy") and mild decreased sensation L lateral lower leg.  Strength, proprioception, coordination, and tone WFL B UE's and LE's.  No balance impairments noted.  No acute PT needs identified.  Will discharge pt from PT in house and complete current PT order.  Please re-consult PT if pt's status changes and acute PT needs are identified.    Follow Up Recommendations No PT follow up    Equipment Recommendations  None recommended by PT    Recommendations for Other Services       Precautions / Restrictions Precautions Precautions: None Restrictions Weight Bearing Restrictions: No      Mobility  Bed Mobility Overal bed mobility: Independent             General bed mobility comments: Supine to/from sit without any difficulties.  Transfers Overall transfer level: Independent Equipment used: None             General transfer comment: steady, strong, safe transfers  Ambulation/Gait Ambulation/Gait assistance: Independent Ambulation Distance (Feet): 260 Feet Assistive device: None Gait Pattern/deviations: WFL(Within Functional Limits)   Gait velocity interpretation: at or above normal speed for age/gender General Gait Details: good B heelstrike; steady  Stairs Stairs: Yes Stairs assistance: Independent Stair Management: No rails;Alternating  pattern;Forwards Number of Stairs: 14 General stair comments: steady and safe technique  Wheelchair Mobility    Modified Rankin (Stroke Patients Only)       Balance Overall balance assessment: Independent(No loss of balance with ambulation and head turns R/L/up/down, increasing/decreasing speed, and turning and stopping)                                           Pertinent Vitals/Pain Pain Assessment: No/denies pain  Vitals (HR and O2 on room air) stable and WFL throughout treatment session.    Home Living Family/patient expects to be discharged to:: Private residence Living Arrangements: Spouse/significant other;Children(Wife and 3 children) Available Help at Discharge: Family Type of Home: House Home Access: Stairs to enter   Entergy CorporationEntrance Stairs-Number of Steps: flight from garage to main level no railing; flight main level to 2nd floor bedroom with R railing Home Layout: Multi-level;Bed/bath upstairs Home Equipment: None      Prior Function Level of Independence: Independent         Comments: Works as Teaching laboratory technicianmaintenance supervisor.  Denies any falls in past 6 months.     Hand Dominance   Dominant Hand: Right    Extremity/Trunk Assessment   Upper Extremity Assessment Upper Extremity Assessment: (Very mild decreased sensation L UE; strength 5/5 B shoulder flexion, 5/5 B elbow flexion/extension, very mild decreased L hand grip compared to R (pt also R handed); coordination B UE's WFL)    Lower Extremity Assessment Lower Extremity Assessment: (mild decreased sensation lateral L lower leg;  B hip flexion, knee flexion/extension 5/5 and DF 5/5 strength; intact B LE tone, proprioception, and heel to shin coordination)    Cervical / Trunk Assessment Cervical / Trunk Assessment: Normal  Communication   Communication: No difficulties  Cognition Arousal/Alertness: Awake/alert Behavior During Therapy: WFL for tasks assessed/performed Overall Cognitive Status:  Within Functional Limits for tasks assessed                                        General Comments   Nursing cleared pt for participation in physical therapy.  Pt agreeable to PT session.    Exercises     Assessment/Plan    PT Assessment Patent does not need any further PT services  PT Problem List         PT Treatment Interventions      PT Goals (Current goals can be found in the Care Plan section)  Acute Rehab PT Goals Patient Stated Goal: to go home PT Goal Formulation: With patient Time For Goal Achievement: 10/27/17 Potential to Achieve Goals: Good    Frequency     Barriers to discharge        Co-evaluation               AM-PAC PT "6 Clicks" Daily Activity  Outcome Measure Difficulty turning over in bed (including adjusting bedclothes, sheets and blankets)?: None Difficulty moving from lying on back to sitting on the side of the bed? : None Difficulty sitting down on and standing up from a chair with arms (e.g., wheelchair, bedside commode, etc,.)?: None Help needed moving to and from a bed to chair (including a wheelchair)?: None Help needed walking in hospital room?: None Help needed climbing 3-5 steps with a railing? : None 6 Click Score: 24    End of Session Equipment Utilized During Treatment: Gait belt Activity Tolerance: Patient tolerated treatment well Patient left: (sitting in transport chair with transport present to take pt to testing) Nurse Communication: Mobility status;Precautions PT Visit Diagnosis: Muscle weakness (generalized) (M62.81)    Time: 1130-1146 PT Time Calculation (min) (ACUTE ONLY): 16 min   Charges:   PT Evaluation $PT Eval Low Complexity: 1 Low     PT G Codes:   PT G-Codes **NOT FOR INPATIENT CLASS** Functional Assessment Tool Used: AM-PAC 6 Clicks Basic Mobility Functional Limitation: Mobility: Walking and moving around Mobility: Walking and Moving Around Current Status (E9528(G8978): 0 percent  impaired, limited or restricted Mobility: Walking and Moving Around Goal Status (U1324(G8979): 0 percent impaired, limited or restricted Mobility: Walking and Moving Around Discharge Status (M0102(G8980): 0 percent impaired, limited or restricted    Hendricks LimesEmily Asia Favata, PT 10/13/17, 12:01 PM [phone]

## 2017-10-13 NOTE — Progress Notes (Signed)
Patient is being discharged to home this evening. Discharge instructions given and patient acknowledged understanding and had no questions. IV removed.  Family is here to take patient home.

## 2017-10-13 NOTE — ED Provider Notes (Signed)
[facility] Emergency Department Provider Note   ____________________________________________   First MD Initiated Contact with Patient 10/13/17 0209     (approximate)  I have reviewed the triage vital signs and the nursing notes.   HISTORY  Chief Complaint Numbness    HPI Juan Jenkins is a 31 y.o. male we will comes into the hospital today with numbness in his left arm. The patient states he woke up at 12:30 with the symptoms. He went to sleep around 10:30 PM and everything felt fine. The patient states that this numbness in his forearm and his hand. He is able to move it but he states that it feels weak and heavy. The patient has had some mild shortness of breath but denies any chest pains. He denies any nausea or vomiting. He had similar symptoms recently [place]. He reports that he was kept there for 2 days but they were unable to perform an MRI because the patient was too large. Given this recurrence he decided to come into the hospital today for evaluation.   Past Medical History:  Diagnosis Date  . Asthma   . Hypertension     Patient Active Problem List   Diagnosis Date Noted  . TIA (transient ischemic attack) 10/13/2017    History reviewed. No pertinent surgical history.  Prior to Admission medications   Medication Sig Start Date End Date Taking? Authorizing Provider  albuterol (PROVENTIL HFA;VENTOLIN HFA) 108 (90 BASE) MCG/ACT inhaler Inhale 2 puffs into the lungs every 6 (six) hours as needed for wheezing.   Yes [provider]  aspirin 81 MG chewable tablet Chew 81 mg daily by mouth.   Yes [provider]  atorvastatin (LIPITOR) 40 MG tablet Take 40 mg daily by mouth.   Yes [provider]  escitalopram (LEXAPRO) 10 MG tablet Take 10 mg daily by mouth.   Yes [provider]  Fluticasone-Salmeterol (ADVAIR DISKUS) 500-50 MCG/DOSE AEPB Inhale 1 puff  into the lungs every 12 (twelve) hours.   Yes [provider]  losartan-hydrochlorothiazide (HYZAAR) 100-25 MG tablet Take 1 tablet daily by mouth.   Yes [provider]  montelukast (SINGULAIR) 10 MG tablet Take 10 mg at bedtime by mouth.   Yes [provider]  potassium chloride (K-DUR) 10 MEQ tablet Take 10 mEq daily by mouth.   Yes [provider]    Allergies Patient has no known allergies.  History reviewed. No pertinent family history.  Social History Social History   Tobacco Use  . Smoking status: Never Smoker  . Smokeless tobacco: Never Used  Substance Use Topics  . Alcohol use: Yes    Comment: occa  . Drug use: No    Review of Systems  Constitutional: No fever/chills Eyes: No visual changes. ENT: No sore throat. Cardiovascular: Denies chest pain. Respiratory: Denies shortness of breath. Gastrointestinal: No abdominal pain.  No nausea, no vomiting.  No diarrhea.  No constipation. Genitourinary: Negative for dysuria. Musculoskeletal: Negative for back pain. Skin: Negative for rash. Neurological: left-sided arm numbness and heaviness   ____________________________________________   PHYSICAL EXAM:  VITAL SIGNS: ED Triage Vitals  Enc Vitals Group     BP 10/13/17 0159 122/79     Pulse Rate 10/13/17 0159 (!) 58     Resp 10/13/17 0159 16     Temp 10/13/17 0159 97.9 F (36.6 C)     Temp Source 10/13/17 0159 Oral  SpO2 10/13/17 0159 98 %     Weight 10/13/17 0159 (!) 301 lb (136.5 kg)     Height 10/13/17 0159 6' (1.829 m)     Head Circumference --      Peak Flow --      Pain Score 10/13/17 0158 0     Pain Loc --      Pain Edu? --      Excl. in GC? --     Constitutional: Alert and oriented. Well appearing and in mild distress. Eyes: Conjunctivae are normal. PERRL. EOMI. Head: Atraumatic. Nose: No congestion/rhinnorhea. Mouth/Throat: Mucous membranes are moist.  Oropharynx non-erythematous. Cardiovascular: Normal  rate, regular rhythm. Grossly normal heart sounds.  Good peripheral circulation. Respiratory: Normal respiratory effort.  No retractions. Lungs CTAB. Gastrointestinal: Soft and nontender. No distention. positive bowel sounds Musculoskeletal: No lower extremity tenderness nor edema.  Neurologic:  Normal speech and language. cranial nerves II through XII are grossly intact with no focal motor neuro deficits. The patient has no pronator drift with finger to nose intact. Rapid alternating movement is also intact. Patient with some mild left lower arm numbness to palpation but strength 5 out of 5 in upper and lower extremities. Sensation intact in lower extremity. Skin:  Skin is warm, dry and intact.  Psychiatric: Mood and affect are normal.   ____________________________________________   LABS (all labs ordered are listed, but only abnormal results are displayed)  Labs Reviewed  COMPREHENSIVE METABOLIC PANEL - Abnormal; Notable for the following components:      Result Value   Glucose, Bld 102 (*)    ALT 65 (*)    All other components within normal limits  PROTIME-INR  APTT  CBC  DIFFERENTIAL  TROPONIN I  GLUCOSE, CAPILLARY  CBG MONITORING, ED   ____________________________________________  EKG  ED ECG REPORT I, Rebecka ApleyWebster,  Frances Joynt P, the attending physician, personally viewed and interpreted this ECG.   Date: 10/13/2017  EKG Time: 206  Rate: 55  Rhythm: sinus bradycardia  Axis: normal  Intervals:none  ST&T Change: none  ____________________________________________  RADIOLOGY  Ct Head Code Stroke Wo Contrast  Result Date: 10/13/2017 CLINICAL DATA:  Code stroke.  LEFT-sided numbness.  Recent TIA. EXAM: CT HEAD WITHOUT CONTRAST TECHNIQUE: Contiguous axial images were obtained from the base of the skull through the vertex without intravenous contrast. COMPARISON:  None. FINDINGS: BRAIN: No intraparenchymal hemorrhage, mass effect nor midline shift. The ventricles and sulci are  normal. No acute large vascular territory infarcts. No abnormal extra-axial fluid collections. Basal cisterns are patent. VASCULAR: Unremarkable. SKULL/SOFT TISSUES: No skull fracture. Old suspected RIGHT nasal bone fracture. No significant soft tissue swelling. ORBITS/SINUSES: The included ocular globes and orbital contents are normal.The mastoid aircells and included paranasal sinuses are well-aerated. OTHER: None. ASPECTS [facility]([place]ta Stroke Program Early CT Score) - Ganglionic level infarction (caudate, lentiform nuclei, internal capsule, insula, M1-M3 cortex): 7 - Supraganglionic infarction (M4-M6 cortex): 3 Total score (0-10 with 10 being normal): 10 IMPRESSION: 1. Normal noncontrast CT HEAD. 2. ASPECTS is 10. 3. Critical Value/emergent results were called by telephone at the time of interpretation on 10/13/2017 at 2:23 am to Dr. York CeriseFORBACH, who verbally acknowledged these results. Electronically Signed   By: Awilda Metroourtnay  Bloomer M.D.   On: 10/13/2017 02:23    ____________________________________________   PROCEDURES  Procedure(s) performed: None  Procedures  Critical Care performed: No  ____________________________________________   INITIAL IMPRESSION / ASSESSMENT AND PLAN / ED COURSE  As part of my medical decision making, I reviewed the following  data within the electronic MEDICAL RECORD NUMBER Notes from prior ED visits and [place] Controlled Substance Database   this is of 31 year old male who comes into the hospital today with some left arm numbness and he reports some heaviness. Given the patient's symptoms he was made a stroke alert. He received an immediate CT as well as neurologic evaluation. On my exam patient's NIH is approximately a 1 given his numbness. His strength is intact and he has no other focal deficit. Neurology saw the patient and also felt that his stroke scale was 01. She reports that he would not be a candidate for TPA given his low stroke score. He was still under the 4-1/2 hour  window. Though did recommend that we admit the patient to the hospitalist service for further evaluation and an MRI. Given his age and his recurrence of symptoms she wants him further evaluated. I did contact the admission physicians to admit the patient for possible TIA and left-sided numbness. The patient will be admitted for further evaluation      ____________________________________________   FINAL CLINICAL IMPRESSION(S) / ED DIAGNOSES  Final diagnoses:  Numbness  Left sided numbness     ED Discharge Orders    None       Note:  This document was prepared using Dragon voice recognition software and may include unintentional dictation errors.    Rebecka Apley, MD 10/13/17 [phone]

## 2017-10-14 LAB — ECHOCARDIOGRAM COMPLETE
CHL CUP MV DEC (S): 151
E decel time: 151 msec
E/e' ratio: 9.3
FS: 39 % (ref 28–44)
HEIGHTINCHES: 72 in
IV/PV OW: 0.58
LA ID, A-P, ES: 49 mm
LA vol index: 20.1 mL/m2
LADIAMINDEX: 1.82 cm/m2
LAVOL: 54.1 mL
LAVOLA4C: 57.6 mL
LEFT ATRIUM END SYS DIAM: 49 mm
LV E/e'average: 9.3
LV PW d: 11.6 mm — AB (ref 0.6–1.1)
LV TDI E'MEDIAL: 12.5
LVEEMED: 9.3
LVELAT: 14.3 cm/s
Lateral S' vel: 16 cm/s
MV pk E vel: 133 m/s
MVAP: 5 cm2
MVPG: 7 mmHg
MVPKAVEL: 85.1 m/s
P 1/2 time: 44 ms
TAPSE: 25.9 mm
TDI e' lateral: 14.3
WEIGHTICAEL: 4816 [oz_av]

## 2017-10-15 LAB — HIV ANTIBODY (ROUTINE TESTING W REFLEX): HIV SCREEN 4TH GENERATION: NONREACTIVE

## 2018-08-31 IMAGING — CT CT HEAD CODE STROKE
3 series · 15 of 47 positions shown, 18 images · non-contrast
Comparison: None.

CLINICAL DATA: Code stroke.  LEFT-sided numbness.  Recent TIA.

EXAM:
CT HEAD WITHOUT CONTRAST
TECHNIQUE: Contiguous axial images were obtained from the base of the skull
through the vertex without intravenous contrast.

[Series 3: head wo · axial · 0.42mm/px · z∈[-62,+63]mm · 9 of 30 slices shown, 12 images]
[im 3/30  brain]
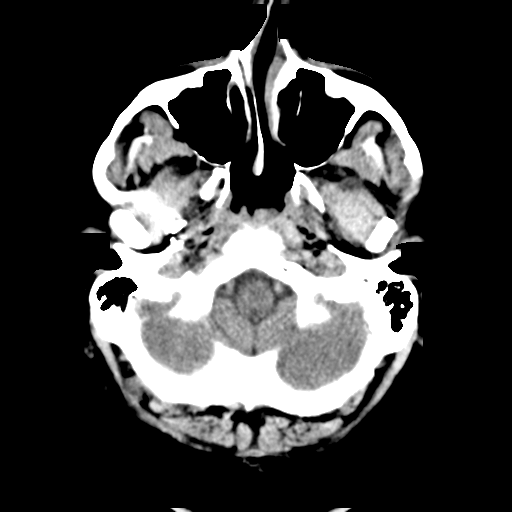
[im 3/30  bone]
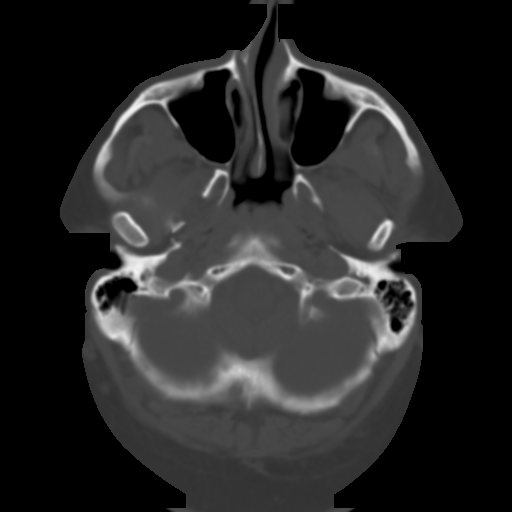
[im 6/30  brain]
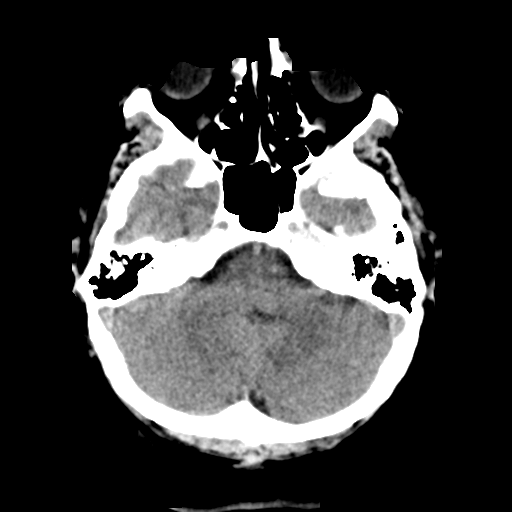
[im 9/30  brain]
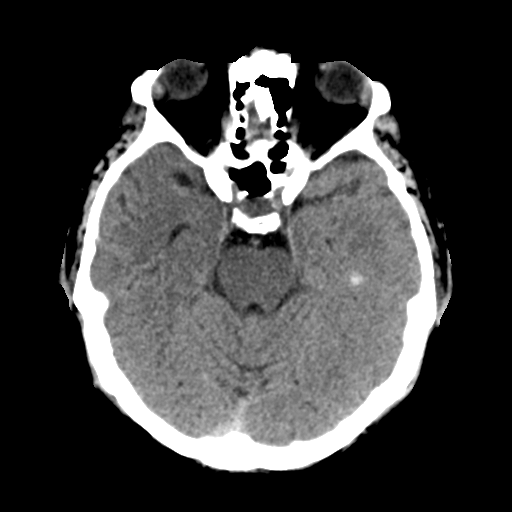
[im 12/30  brain]
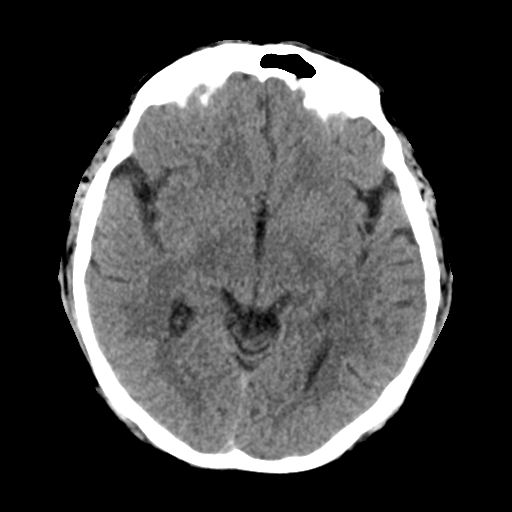
[im 16/30  brain]
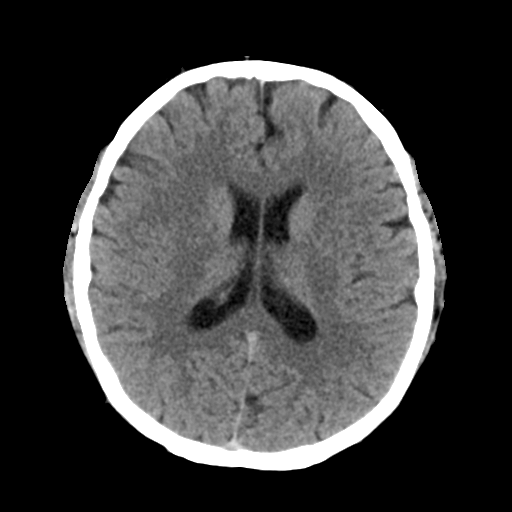
[im 16/30  bone]
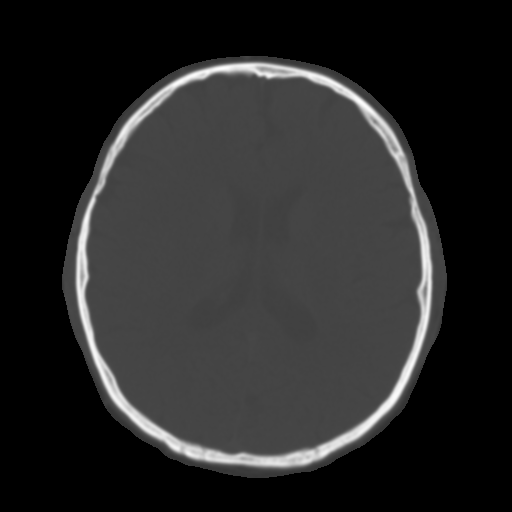
[im 19/30  brain]
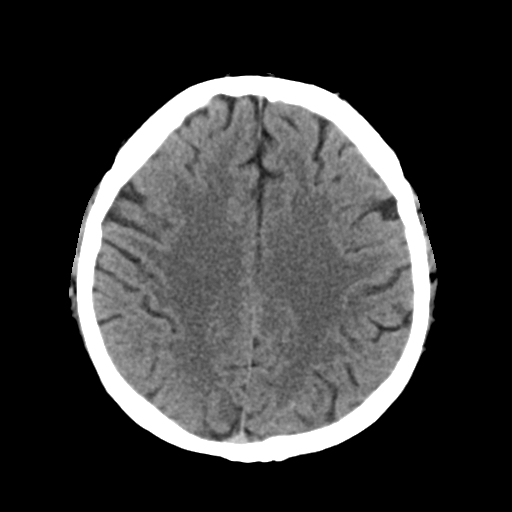
[im 22/30  brain]
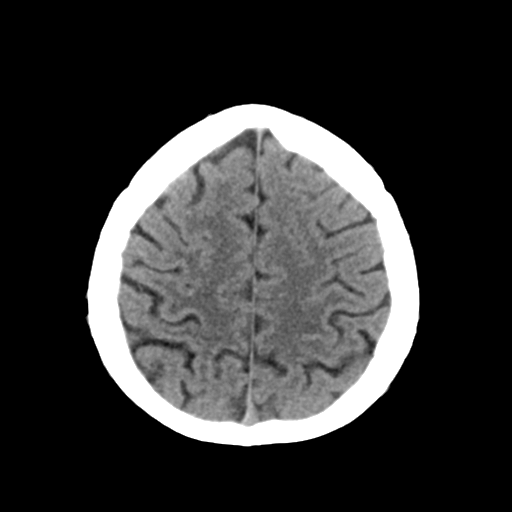
[im 25/30  brain]
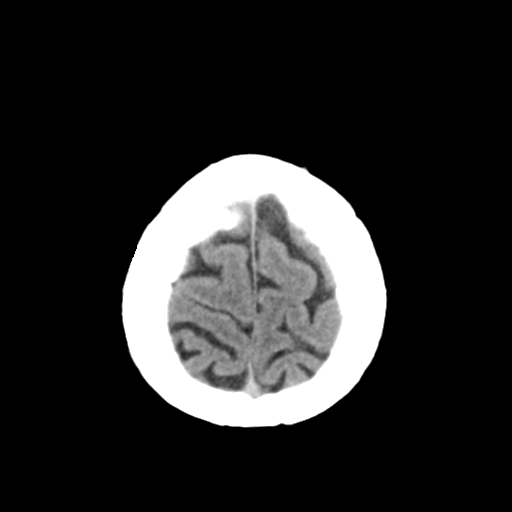
[im 28/30  brain]
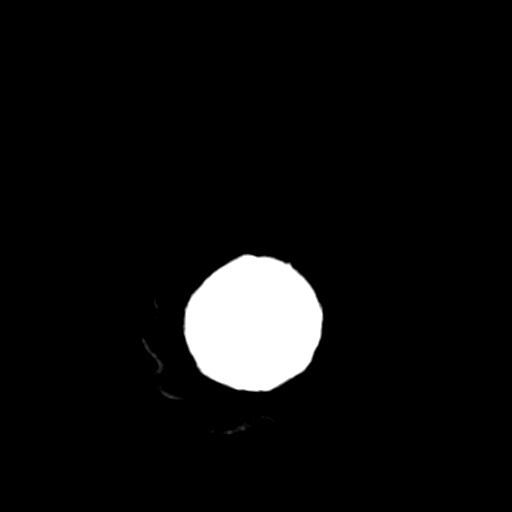
[im 28/30  bone]
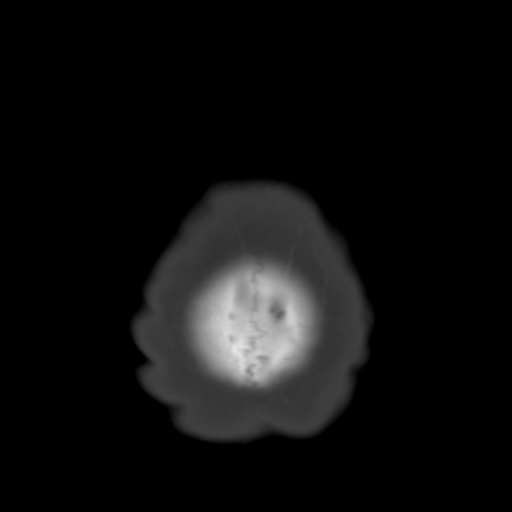

[Series 4: coronal soft tissue · coronal · 0.27mm/px · 3 of 62 slices shown]
[im 21/62  brain]
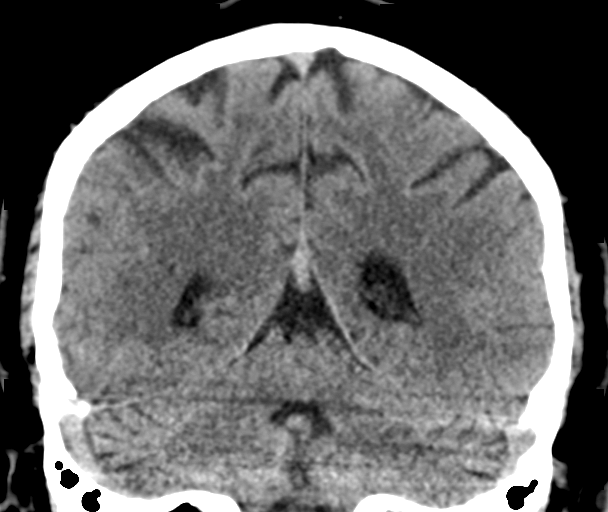
[im 28/62  brain]
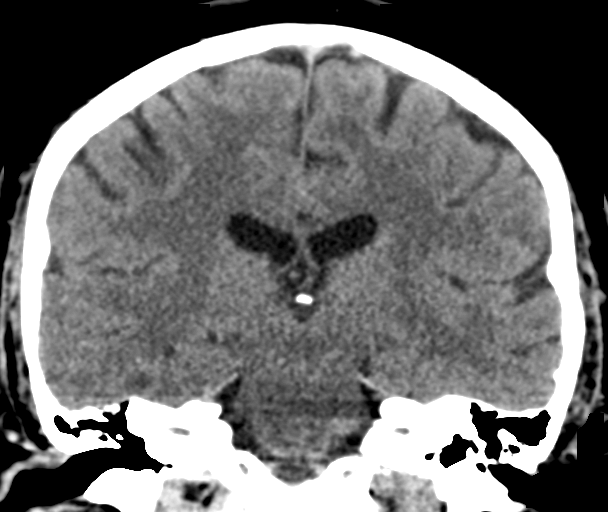
[im 34/62  brain]
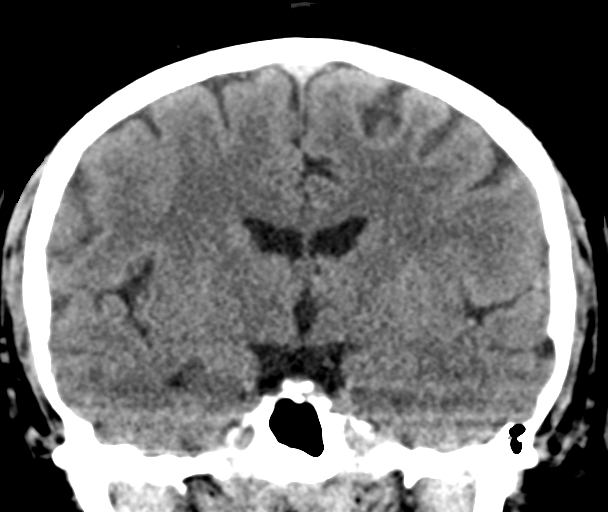

[Series 5: sagittal soft tissue · sagittal · 0.27mm/px · 3 of 56 slices shown]
[im 19/56  brain]
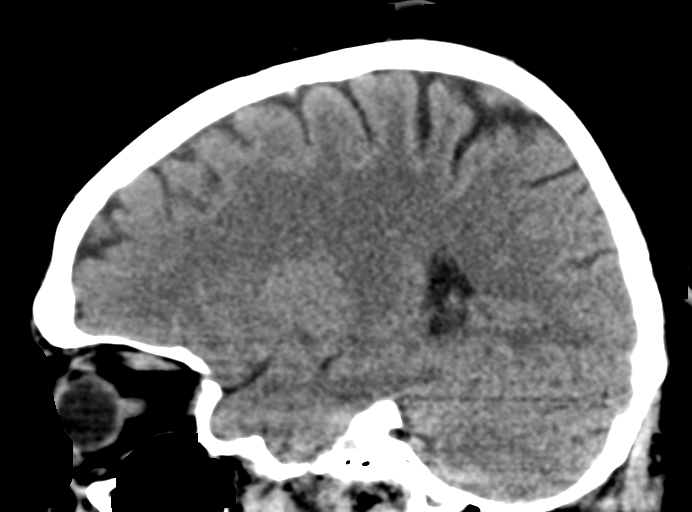
[im 28/56  brain]
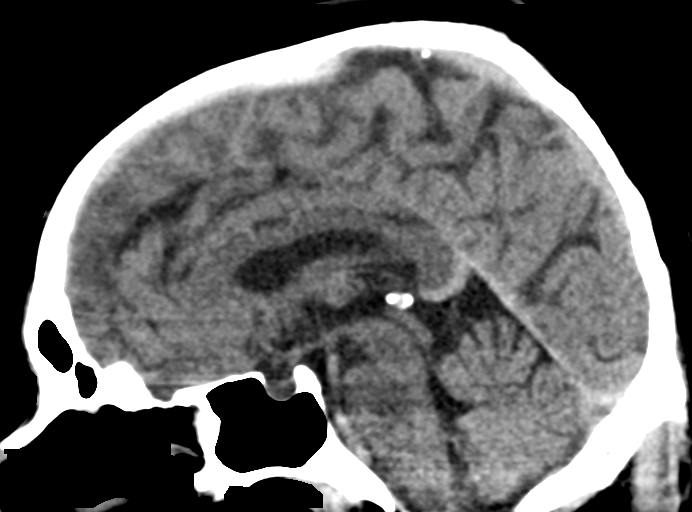
[im 37/56  brain]
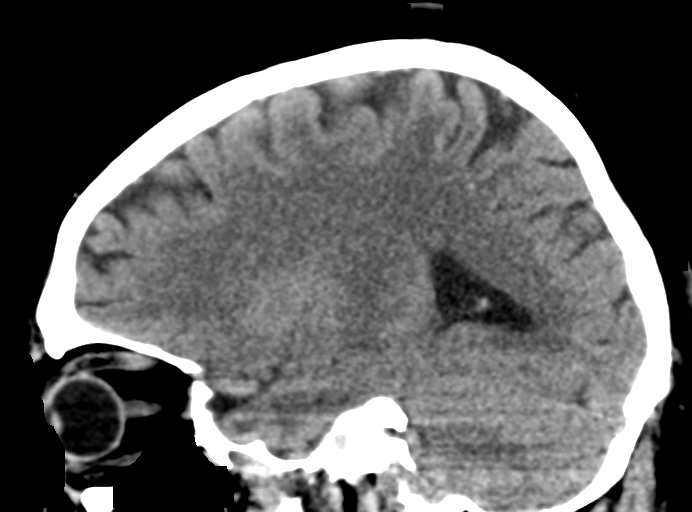

[15 of 47 positions shown; findings below may reference images not displayed]

FINDINGS: BRAIN: No intraparenchymal hemorrhage, mass effect nor midline
shift. The ventricles and sulci are normal. No acute large vascular
territory infarcts. No abnormal extra-axial fluid collections. Basal
cisterns are patent.

VASCULAR: Unremarkable.

SKULL/SOFT TISSUES: No skull fracture. Old suspected RIGHT nasal
bone fracture. No significant soft tissue swelling.

ORBITS/SINUSES: The included ocular globes and orbital contents are
normal.The mastoid aircells and included paranasal sinuses are
well-aerated.

OTHER: None.

ASPECTS (Alberta Stroke Program Early CT Score)

- Ganglionic level infarction (caudate, lentiform nuclei, internal
capsule, insula, M1-M3 cortex): 7

- Supraganglionic infarction (M4-M6 cortex): 3

Total score (0-10 with 10 being normal): 10
IMPRESSION: 1. Normal noncontrast CT HEAD.
2. ASPECTS is 10.
3. Critical Value/emergent results were called by telephone at the
time of interpretation on 10/13/2017 at [DATE] to Dr. CFR, who
verbally acknowledged these results.
